# Patient Record
Sex: Female | Born: 2000 | Race: White | Hispanic: No | Marital: Single | Smoking: Never smoker
Health system: Southern US, Community
[De-identification: ages and names within clinical notes are randomized; demographics above are authoritative.]

## PROBLEM LIST (undated history)

## (undated) DIAGNOSIS — R51 Headache: Secondary | ICD-10-CM

## (undated) HISTORY — DX: Headache: R51

---
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
## 2018-01-20 DIAGNOSIS — J301 Allergic rhinitis due to pollen: Secondary | ICD-10-CM | POA: Diagnosis not present

## 2018-01-20 DIAGNOSIS — J4520 Mild intermittent asthma, uncomplicated: Secondary | ICD-10-CM | POA: Diagnosis not present

## 2018-01-20 DIAGNOSIS — L209 Atopic dermatitis, unspecified: Secondary | ICD-10-CM | POA: Diagnosis not present

## 2018-01-20 DIAGNOSIS — J3089 Other allergic rhinitis: Secondary | ICD-10-CM | POA: Diagnosis not present

## 2018-05-03 DIAGNOSIS — Z111 Encounter for screening for respiratory tuberculosis: Secondary | ICD-10-CM | POA: Diagnosis not present

## 2018-05-19 DIAGNOSIS — R102 Pelvic and perineal pain: Secondary | ICD-10-CM | POA: Diagnosis not present

## 2018-05-19 DIAGNOSIS — L659 Nonscarring hair loss, unspecified: Secondary | ICD-10-CM | POA: Diagnosis not present

## 2018-05-19 DIAGNOSIS — K649 Unspecified hemorrhoids: Secondary | ICD-10-CM | POA: Diagnosis not present

## 2018-05-19 DIAGNOSIS — R82998 Other abnormal findings in urine: Secondary | ICD-10-CM | POA: Diagnosis not present

## 2018-05-19 DIAGNOSIS — E282 Polycystic ovarian syndrome: Secondary | ICD-10-CM | POA: Diagnosis not present

## 2018-09-01 DIAGNOSIS — K582 Mixed irritable bowel syndrome: Secondary | ICD-10-CM | POA: Diagnosis not present

## 2018-10-14 DIAGNOSIS — J301 Allergic rhinitis due to pollen: Secondary | ICD-10-CM | POA: Diagnosis not present

## 2018-10-14 DIAGNOSIS — J3089 Other allergic rhinitis: Secondary | ICD-10-CM | POA: Diagnosis not present

## 2018-10-14 DIAGNOSIS — J3081 Allergic rhinitis due to animal (cat) (dog) hair and dander: Secondary | ICD-10-CM | POA: Diagnosis not present

## 2018-10-14 DIAGNOSIS — J209 Acute bronchitis, unspecified: Secondary | ICD-10-CM | POA: Diagnosis not present

## 2018-11-23 DIAGNOSIS — Z01419 Encounter for gynecological examination (general) (routine) without abnormal findings: Secondary | ICD-10-CM | POA: Diagnosis not present

## 2018-11-23 DIAGNOSIS — Z6835 Body mass index (BMI) 35.0-35.9, adult: Secondary | ICD-10-CM | POA: Diagnosis not present

## 2019-01-20 DIAGNOSIS — J4520 Mild intermittent asthma, uncomplicated: Secondary | ICD-10-CM | POA: Diagnosis not present

## 2019-01-20 DIAGNOSIS — J3089 Other allergic rhinitis: Secondary | ICD-10-CM | POA: Diagnosis not present

## 2019-01-20 DIAGNOSIS — J301 Allergic rhinitis due to pollen: Secondary | ICD-10-CM | POA: Diagnosis not present

## 2019-01-20 DIAGNOSIS — J3081 Allergic rhinitis due to animal (cat) (dog) hair and dander: Secondary | ICD-10-CM | POA: Diagnosis not present

## 2019-07-04 ENCOUNTER — Other Ambulatory Visit: Payer: BLUE CROSS/BLUE SHIELD

## 2020-05-30 ENCOUNTER — Telehealth: Payer: Self-pay

## 2020-05-30 NOTE — Telephone Encounter (Signed)
NOTES ON FILE FROM  [facility] [phone], SENT REFERRAL TO SCHEDULING

## 2020-06-08 ENCOUNTER — Other Ambulatory Visit: Payer: Self-pay

## 2020-06-08 ENCOUNTER — Ambulatory Visit: Payer: BLUE CROSS/BLUE SHIELD | Admitting: Internal Medicine

## 2020-06-08 ENCOUNTER — Encounter: Payer: Self-pay | Admitting: Internal Medicine

## 2020-06-08 VITALS — BP 128/90 | HR 60 | Ht 63.0 in | Wt 168.6 lb

## 2020-06-08 DIAGNOSIS — J45909 Unspecified asthma, uncomplicated: Secondary | ICD-10-CM | POA: Insufficient documentation

## 2020-06-08 DIAGNOSIS — R011 Cardiac murmur, unspecified: Secondary | ICD-10-CM | POA: Diagnosis not present

## 2020-06-08 DIAGNOSIS — R0789 Other chest pain: Secondary | ICD-10-CM | POA: Diagnosis not present

## 2020-06-08 DIAGNOSIS — J4520 Mild intermittent asthma, uncomplicated: Secondary | ICD-10-CM

## 2020-06-08 NOTE — Progress Notes (Signed)
Cardiology Office Note:    Date:  06/08/2020   ID:  Carla Allison, DOB 09-Jul-2000, MRN [id number]  PCP:  Deatra James, MD  [facility] Cardiologist:  No primary care provider on file.  [facility] Electrophysiologist:  None   CC: Chest pain Consulted for the evaluation of heart murmur at the behest of Deatra James, MD  History of Present Illness:    Carla Allison is a 19 y.o. female with a hx of heart murmur, asthma who presents for evaluation.  Patient notes that she is feeling OK presently.  Has had chest pain back in November, worse with activity, not helped by inhaler.  Was given prednisone and after tapering the chest pain came back.  Discomfort occurred with walking fast during walking to class, did not worsen the exercise or position, and feels better post finals. No shortness of breath, DOE . No syncope or near syncope.  Notes  no palpitations or funny heart beats.     Patient reports no prior cardiac testing including  echo,  stress test,  heart catheterizations,  cardioversion,  ablations.  Past Medical History:  Diagnosis Date  . Headache(784.0)     History reviewed. No pertinent surgical history.  Current Medications: Current Meds  Medication Sig  . acetaminophen (TYLENOL) 325 MG tablet Take 650 mg by mouth every 6 (six) hours as needed. Headache and pain  . cetirizine (ZYRTEC) 10 MG chewable tablet Chew 10 mg by mouth daily.  . fluticasone (FLONASE) 50 MCG/ACT nasal spray Place 2 sprays into both nostrils daily as needed for allergies or rhinitis.  Marland Kitchen ibuprofen (ADVIL,MOTRIN) 200 MG tablet Take 200 mg by mouth every 6 (six) hours as needed. For pain  . levocetirizine (XYZAL) 5 MG tablet Take 5 mg by mouth every evening.  . norethindrone-ethinyl estradiol (OVCON-35,BALZIVA,BRIELLYN) 0.4-35 MG-MCG tablet Take 1 tablet by mouth daily.  . Pediatric Multivit-Minerals-C (FLINTSTONES GUMMIES PLUS PO) Take 2 tablets by mouth.  . propranolol (INDERAL) 20 MG tablet Take  20 mg by mouth 2 (two) times daily as needed.  . pseudoephedrine-guaifenesin (MUCINEX D) 60-600 MG per tablet Take 1 tablet by mouth every 12 (twelve) hours as needed for congestion.     Allergies:   Other   Social History   Socioeconomic History  . Marital status: Single    Spouse name: Not on file  . Number of children: Not on file  . Years of education: Not on file  . Highest education level: Not on file  Occupational History  . Not on file  Tobacco Use  . Smoking status: Never Smoker  . Smokeless tobacco: Never Used  Substance and Sexual Activity  . Alcohol use: No  . Drug use: No  . Sexual activity: Never  Other Topics Concern  . Not on file  Social History Narrative  . Not on file   Social Determinants of Health   Financial Resource Strain: Not on file  Food Insecurity: Not on file  Transportation Needs: Not on file  Physical Activity: Not on file  Stress: Not on file  Social Connections: Not on file    Exercise Physiology student and [employer]  Family History: The patient's family history includes Migraines in her mother. History of coronary artery disease notable for no members. History of heart failure notable for no members. No history of bicuspid aortic valve; but grandfather had history of aortic dissection.  ROS:   Please see the history of present illness.     All other  systems reviewed and are negative.  EKGs/Labs/Other Studies Reviewed:    The following studies were reviewed today:  EKG:  EKG is  ordered today.  The ekg ordered today demonstrates SR rate 60 05/23/20 OSH SR rate 60 WNL  Recent Labs: No results found for requested labs within last 8760 hours.  Recent Lipid Panel No results found for: CHOL, TRIG, HDL, CHOLHDL, VLDL, LDLCALC, LDLDIRECT  OSH Labs 05/23/20 TSH 3.350 Hgb 14.7 Plt 223  Risk Assessment/Calculations:     N/A  Physical Exam:    VS:  BP 128/90   Pulse 60   Ht 5\' 3"  (1.6 m)   Wt 168 lb 9.6 oz (76.5 kg)   SpO2 99%    BMI 29.87 kg/m     Wt Readings from Last 3 Encounters:  06/08/20 168 lb 9.6 oz (76.5 kg)  03/22/15 164 lb 3.2 oz (74.5 kg) (93 %, Z= 1.49)*  07/12/13 159 lb 9.6 oz (72.4 kg) (95 %, Z= 1.60)*   * Growth percentiles are based on CDC (Girls, 2-20 Years) data.   GEN: Well nourished, well developed in no acute distress HEENT: Normal NECK: No JVD; No carotid bruits LYMPHATICS: No lymphadenopathy CARDIAC: RRR, no murmurs, rubs, gallops, no RV heave; +2 radial pulses bilaterally RESPIRATORY:  Clear to auscultation without rales, wheezing or rhonchi  ABDOMEN: Soft, non-tender, non-distended MUSCULOSKELETAL:  No edema; No deformity  SKIN: Warm and dry NEUROLOGIC:  Alert and oriented x 3 PSYCHIATRIC:  Normal affect   ASSESSMENT:    1. Murmur   2. Atypical chest pain    PLAN:    In order of problems listed above:  Heart Murmur Atypical Chest Pain - will get echo to look for pericardial effusion or LV dysfunction - if normal and CP resolves, will defer more aggressive therapy  PRN follow up unless new symptoms or abnormal test results warranting change in plan  Medication Adjustments/Labs and Tests Ordered: Current medicines are reviewed at length with the patient today.  Concerns regarding medicines are outlined above.  Orders Placed This Encounter  Procedures  . EKG 12-Lead  . ECHOCARDIOGRAM COMPLETE   No orders of the defined types were placed in this encounter.   Patient Instructions  Medication Instructions:  Your physician recommends that you continue on your current medications as directed. Please refer to the Current Medication list given to you today.  *If you need a refill on your cardiac medications before your next appointment, please call your pharmacy*   Lab Work: None If you have labs (blood work) drawn today and your tests are completely normal, you will receive your results only by: 07/14/13 MyChart Message (if you have MyChart) OR . A paper copy in the  mail If you have any lab test that is abnormal or we need to change your treatment, we will call you to review the results.   Testing/Procedures: Your physician has requested that you have an echocardiogram. Echocardiography is a painless test that uses sound waves to create images of your heart. It provides your doctor with information about the size and shape of your heart and how well your heart's chambers and valves are working. This procedure takes approximately one hour. There are no restrictions for this procedure.    Follow-Up: At [facility], you and your health needs are our priority.  As part of our continuing mission to provide you with exceptional heart care, we have created designated Provider Care Teams.  These Care Teams include your primary Cardiologist (physician)  and Advanced Practice Providers (APPs -  Physician Assistants and Nurse Practitioners) who all work together to provide you with the care you need, when you need it.  We recommend signing up for the patient portal called "MyChart".  Sign up information is provided on this After Visit Summary.  MyChart is used to connect with patients for Virtual Visits (Telemedicine).  Patients are able to view lab/test results, encounter notes, upcoming appointments, etc.  Non-urgent messages can be sent to your provider as well.   To learn more about what you can do with MyChart, go to [email or web address].    Your next appointment:   As needed  The format for your next appointment:   In Person  Provider:   You may see Carla Lam, MD or one of the following Advanced Practice Providers on your designated Care Team:    Ronie Spies, PA-C  Jacolyn Reedy, PA-C    Other Instructions      Signed, Christell Constant, MD  06/08/2020 9:33 AM    [facility]

## 2020-06-08 NOTE — Patient Instructions (Signed)
Medication Instructions:  Your physician recommends that you continue on your current medications as directed. Please refer to the Current Medication list given to you today.  *If you need a refill on your cardiac medications before your next appointment, please call your pharmacy*   Lab Work: None If you have labs (blood work) drawn today and your tests are completely normal, you will receive your results only by: Marland Kitchen MyChart Message (if you have MyChart) OR . A paper copy in the mail If you have any lab test that is abnormal or we need to change your treatment, we will call you to review the results.   Testing/Procedures: Your physician has requested that you have an echocardiogram. Echocardiography is a painless test that uses sound waves to create images of your heart. It provides your doctor with information about the size and shape of your heart and how well your heart's chambers and valves are working. This procedure takes approximately one hour. There are no restrictions for this procedure.    Follow-Up: At [facility], you and your health needs are our priority.  As part of our continuing mission to provide you with exceptional heart care, we have created designated Provider Care Teams.  These Care Teams include your primary Cardiologist (physician) and Advanced Practice Providers (APPs -  Physician Assistants and Nurse Practitioners) who all work together to provide you with the care you need, when you need it.  We recommend signing up for the patient portal called "MyChart".  Sign up information is provided on this After Visit Summary.  MyChart is used to connect with patients for Virtual Visits (Telemedicine).  Patients are able to view lab/test results, encounter notes, upcoming appointments, etc.  Non-urgent messages can be sent to your provider as well.   To learn more about what you can do with MyChart, go to [email or web address].    Your next appointment:   As  needed  The format for your next appointment:   In Person  Provider:   You may see Riley Lam, MD or one of the following Advanced Practice Providers on your designated Care Team:    Ronie Spies, PA-C  Jacolyn Reedy, PA-C    Other Instructions

## 2020-06-25 ENCOUNTER — Telehealth: Payer: Self-pay | Admitting: Internal Medicine

## 2020-06-25 NOTE — Telephone Encounter (Signed)
Patient states she tested positive on Saturday for covid and would like to know any treatment options like the antibody infusion. She states she called her allergist and PCP and has not heard back from them.

## 2020-06-25 NOTE — Telephone Encounter (Signed)
Called patient to let her know that there is currently a very limited supply of antibody infusions and to contact her PCP regarding a referral if deemed necessary.  She verbalized understanding.

## 2020-07-02 ENCOUNTER — Other Ambulatory Visit (HOSPITAL_COMMUNITY): Payer: BLUE CROSS/BLUE SHIELD

## 2020-07-16 ENCOUNTER — Ambulatory Visit (HOSPITAL_COMMUNITY): Payer: 59 | Attending: Cardiology

## 2020-07-16 ENCOUNTER — Other Ambulatory Visit: Payer: Self-pay

## 2020-07-16 DIAGNOSIS — R011 Cardiac murmur, unspecified: Secondary | ICD-10-CM | POA: Insufficient documentation

## 2020-07-16 DIAGNOSIS — R0789 Other chest pain: Secondary | ICD-10-CM | POA: Diagnosis present

## 2020-07-16 LAB — ECHOCARDIOGRAM COMPLETE
Area-P 1/2: 3.48 cm2
S' Lateral: 2.8 cm

## 2020-07-18 ENCOUNTER — Telehealth: Payer: Self-pay

## 2020-07-18 NOTE — Progress Notes (Unsigned)
Left the patient a message to return call to office regarding Echocardiogram results.

## 2020-07-18 NOTE — Telephone Encounter (Signed)
-----   Message from Mahesh A Chandrasekhar, MD sent at 07/18/2020  8:22 AM EST ----- Results: Normal BiV function Plan: PRN Follow up  Mahesh A Chandrasekhar, MD  

## 2020-07-18 NOTE — Telephone Encounter (Signed)
Left the patient a message to return call to office regarding Echo results.

## 2020-07-20 ENCOUNTER — Telehealth: Payer: Self-pay

## 2020-07-20 NOTE — Telephone Encounter (Signed)
The patient has been notified of the result and verbalized understanding.  All questions (if any) were answered. Leanord Hawking, RN 07/20/2020 8:59 AM

## 2020-07-20 NOTE — Telephone Encounter (Signed)
-----   Message from Christell Constant, MD sent at 07/18/2020  8:22 AM EST ----- Results: Normal BiV function Plan: PRN Follow up  Christell Constant, MD
